# Patient Record
Sex: Female | Born: 1990 | Race: White | Hispanic: No | Marital: Single | Smoking: Never smoker
Health system: Southern US, Community
[De-identification: ages and names within clinical notes are randomized; demographics above are authoritative.]

## PROBLEM LIST (undated history)

## (undated) DIAGNOSIS — K589 Irritable bowel syndrome without diarrhea: Secondary | ICD-10-CM

## (undated) DIAGNOSIS — E785 Hyperlipidemia, unspecified: Secondary | ICD-10-CM

## (undated) HISTORY — PX: CHOLECYSTECTOMY: SHX55

---

## 2013-07-30 ENCOUNTER — Emergency Department (HOSPITAL_COMMUNITY)
Admission: EM | Admit: 2013-07-30 | Discharge: 2013-07-30 | Disposition: A | Discharge status: Home / Self Care | Payer: BC Managed Care – PPO | Attending: Emergency Medicine | Admitting: Emergency Medicine

## 2013-07-30 ENCOUNTER — Emergency Department (HOSPITAL_COMMUNITY): Payer: BC Managed Care – PPO

## 2013-07-30 ENCOUNTER — Encounter (HOSPITAL_COMMUNITY): Payer: Self-pay | Admitting: Emergency Medicine

## 2013-07-30 DIAGNOSIS — F411 Generalized anxiety disorder: Secondary | ICD-10-CM | POA: Insufficient documentation

## 2013-07-30 DIAGNOSIS — Z8719 Personal history of other diseases of the digestive system: Secondary | ICD-10-CM | POA: Insufficient documentation

## 2013-07-30 DIAGNOSIS — F419 Anxiety disorder, unspecified: Secondary | ICD-10-CM

## 2013-07-30 DIAGNOSIS — Z79899 Other long term (current) drug therapy: Secondary | ICD-10-CM | POA: Insufficient documentation

## 2013-07-30 HISTORY — DX: Hyperlipidemia, unspecified: E78.5

## 2013-07-30 HISTORY — DX: Irritable bowel syndrome without diarrhea: K58.9

## 2013-07-30 MED ORDER — LORAZEPAM 1 MG PO TABS: 1.0000 mg | ORAL_TABLET | Freq: Three times a day (TID) | ORAL | Status: AC | PRN

## 2013-07-30 MED ORDER — LORAZEPAM 1 MG PO TABS
1.0000 mg | ORAL_TABLET | Freq: Once | ORAL | Status: AC
Start: 2013-07-30 — End: 2013-07-30
  Administered 2013-07-30: 1 mg via ORAL
  Filled 2013-07-30: qty 1

## 2013-07-30 MED ORDER — LORAZEPAM 2 MG/ML IJ SOLN
1.0000 mg | Freq: Once | INTRAMUSCULAR | Status: AC
  Administered 2013-07-30: 1 mg via INTRAVENOUS
  Filled 2013-07-30: qty 1

## 2013-07-30 MED ORDER — DIPHENHYDRAMINE HCL 25 MG PO CAPS
50.0000 mg | ORAL_CAPSULE | Freq: Once | ORAL | Status: AC
  Administered 2013-07-30: 50 mg via ORAL
  Filled 2013-07-30: qty 2

## 2013-07-30 MED ORDER — SODIUM CHLORIDE 0.9 % IV BOLUS (SEPSIS)
1000.0000 mL | Freq: Once | INTRAVENOUS | Status: AC
  Administered 2013-07-30: 1000 mL via INTRAVENOUS

## 2013-07-30 MED ORDER — ONDANSETRON 4 MG PO TBDP
4.0000 mg | ORAL_TABLET | Freq: Once | ORAL | Status: AC
  Administered 2013-07-30: 4 mg via ORAL
  Filled 2013-07-30: qty 1

## 2013-07-30 NOTE — ED Provider Notes (Signed)
CSN: [id number]     Arrival date & time 07/30/13  1039 History   First MD Initiated Contact with Patient 07/30/13 1115     Chief Complaint  Patient presents with  . throat tightness      (Consider location/radiation/quality/duration/timing/severity/associated sxs/prior Treatment) HPI Pt presenting with c/o throat tightness and difficulty breathing.  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  Symptoms became gradually worse and she felt that her throat was swollen.  UPon presentation to the ED she is tearful and feels anxious.  She does state she was having difficulty controlling her nerves today.  No hx of anxiety or panic attacks.  No sore throat.  No fever.  No hives or itching rash.  No swelling of lips or tongue.  No new exposures or foods.  There are no other associated systemic symptoms, there are no other alleviating or modifying factors.   Past Medical History  Diagnosis Date  . Hyperlipemia   . IBS (irritable bowel syndrome)    Past Surgical History  Procedure Laterality Date  . Cholecystectomy     No family history on file. History  Substance Use Topics  . Smoking status: Never Smoker   . Smokeless tobacco: Not on file  . Alcohol Use: No   OB History   Grav Para Term Preterm Abortions TAB SAB Ect Mult Living                 Review of Systems ROS reviewed and all otherwise negative except for mentioned in HPI    Allergies  Review of patient's allergies indicates no known allergies.  Home Medications   Prior to Admission medications   Medication Sig Start Date End Date Taking? Authorizing Provider  aspirin-acetaminophen-caffeine (EXCEDRIN MIGRAINE) [phone] MG per tablet Take 1 tablet by mouth every 6 (six) hours as needed for headache or migraine.   Yes Historical Provider, MD  Naltrexone-Bupropion HCl ER (CONTRAVE) 8-90 MG TB12 Take 1 tablet by mouth 2 (two) times daily.   Yes Historical Provider,  MD  norgestimate-ethinyl estradiol (SPRINTEC 28) 0.25-35 MG-MCG tablet Take 1 tablet by mouth every morning.   Yes Historical Provider, MD  LORazepam (ATIVAN) 1 MG tablet Take 1 tablet (1 mg total) by mouth 3 (three) times daily as needed for anxiety. 07/30/13   Ethelda Chick, MD   BP 136/77  Pulse 97  Temp(Src) 98.9 F (37.2 C) (Oral)  Resp 18  SpO2 99%  LMP 07/30/2013 Vitals reviewed Physical Exam Physical Examination: General appearance - alert, anxious appearing, and in no distress Mental status - alert, oriented to person, place, and time Eyes - pupils equal and reactive, no scleral icterus, no eyelid edema Mouth - mucous membranes moist, pharynx normal without lesions, uvula midline, palate symmetric Neck - supple, no significant adenopathy Chest - clear to auscultation, no wheezes, rales or rhonchi, symmetric air entry, normal respiratory effort Heart - normal rate, regular rhythm, normal S1, S2, no murmurs, rubs, clicks or gallops Abdomen - soft, nontender, nondistended, no masses or organomegaly Extremities - peripheral pulses normal, no pedal edema, no clubbing or cyanosis Skin - normal coloration and turgor, no rashes Psych- tearful and anxious appearing, but cooperative  ED Course  Procedures (including critical care time)  2:54 PM on recheck patient is feeling improved, she is sleepy from ativan.   Labs Review Labs Reviewed - No data to display  Imaging Review Dg Neck Soft Tissue  07/30/2013  CLINICAL DATA:  Throat tightness  EXAM: NECK SOFT TISSUES - 1+ VIEW  COMPARISON:  None.  FINDINGS: Frontal and lateral views were obtained. The epiglottis and aryepiglottic folds appear normal. Prevertebral soft tissues are normal. There is no air-fluid level to suggest abscess. The tongue base region as well as the tonsils and adenoidal structures appear unremarkable. No bone lesions are identified.  IMPRESSION: No abnormality appreciable.   Electronically Signed   By: Bretta BangWilliam   Woodruff M.D.   On: 07/30/2013 11:46     EKG Interpretation None      MDM   Final diagnoses:  Anxiety    Pt presenting with c/o sensation of difficulty breathing and tightness in throat during nursing clinicals orientation.  Pt has normal exam, no rash, appears anxious and tearful.  No signs of angioedema or allergic reaction.  Pt treated with ativan which improved her symptoms.  Pt initially tachycardic but this resolved on recheck.  Suspect symptoms are due to anxiety.   Discharged with strict return precautions.  Pt agreeable with plan.    Ethelda ChickMartha K Linker, MD 07/30/13 [phone]

## 2013-07-30 NOTE — Progress Notes (Signed)
  CARE MANAGEMENT ED NOTE 07/30/2013  Patient:  Kylie Avery, Kylie Avery   Account Number:  [id number]  Date Initiated:  07/30/2013  Documentation initiated by:  Edd Arbour  Subjective/Objective Assessment:   23 yr old female [place] [employer] resident ([place] listed in [place]) in orientation for Nursing and c/o of "throat tightness" that started this am 0630 that is intermittent. Denies SOB at this time. Does not have hives or the     Subjective/Objective Assessment Detail:   appearance of an allergic reaction. Pt report some anxiety today saying she was "having problems with her nerves". Pt in NAD. EMS reports BP 147/100, HR 120, 98% on room air. treated with ativan in ED and improves  Confirm pcp is Dr Alinda Deem in [address]      Action/Plan:   CM spoke with pt and female at bedside Updated pcp in EPIC   Action/Plan Detail:   Anticipated DC Date:  07/30/2013     Status Recommendation to Physician:   Result of Recommendation:    Other ED Services  Consult Working Plan    DC Planning Services  Other  PCP issues  Outpatient Services - Pt will follow up    Choice offered to / List presented to:            Status of service:  Completed, signed off  ED Comments:   ED Comments Detail:

## 2013-07-30 NOTE — Discharge Instructions (Signed)
Return to the ED with any concerns including difficulty breathing, vomiting and not able to keep down liquids, fainting, decreased level of alertness/lethargy, or any other alarming symptoms °

## 2013-07-30 NOTE — ED Notes (Signed)
Per EMS in orientation for Nursing and c/o of "throat tightness" that started this am 0630 that is intermittent. Denies SOB at this time. Does not have hives or the appearance of an allergic reaction. Pt report some anxiety today saying she was "having problems with her nerves". Pt in NAD. EMS reports BP 147/100, HR 120, 98% on room air.

## 2015-12-05 IMAGING — CR DG NECK SOFT TISSUE
2 series · 2 of 2 positions shown · non-contrast
Comparison: None.

CLINICAL DATA: Throat tightness

EXAM:
NECK SOFT TISSUES - 1+ VIEW

[w soft tissue neck lat]
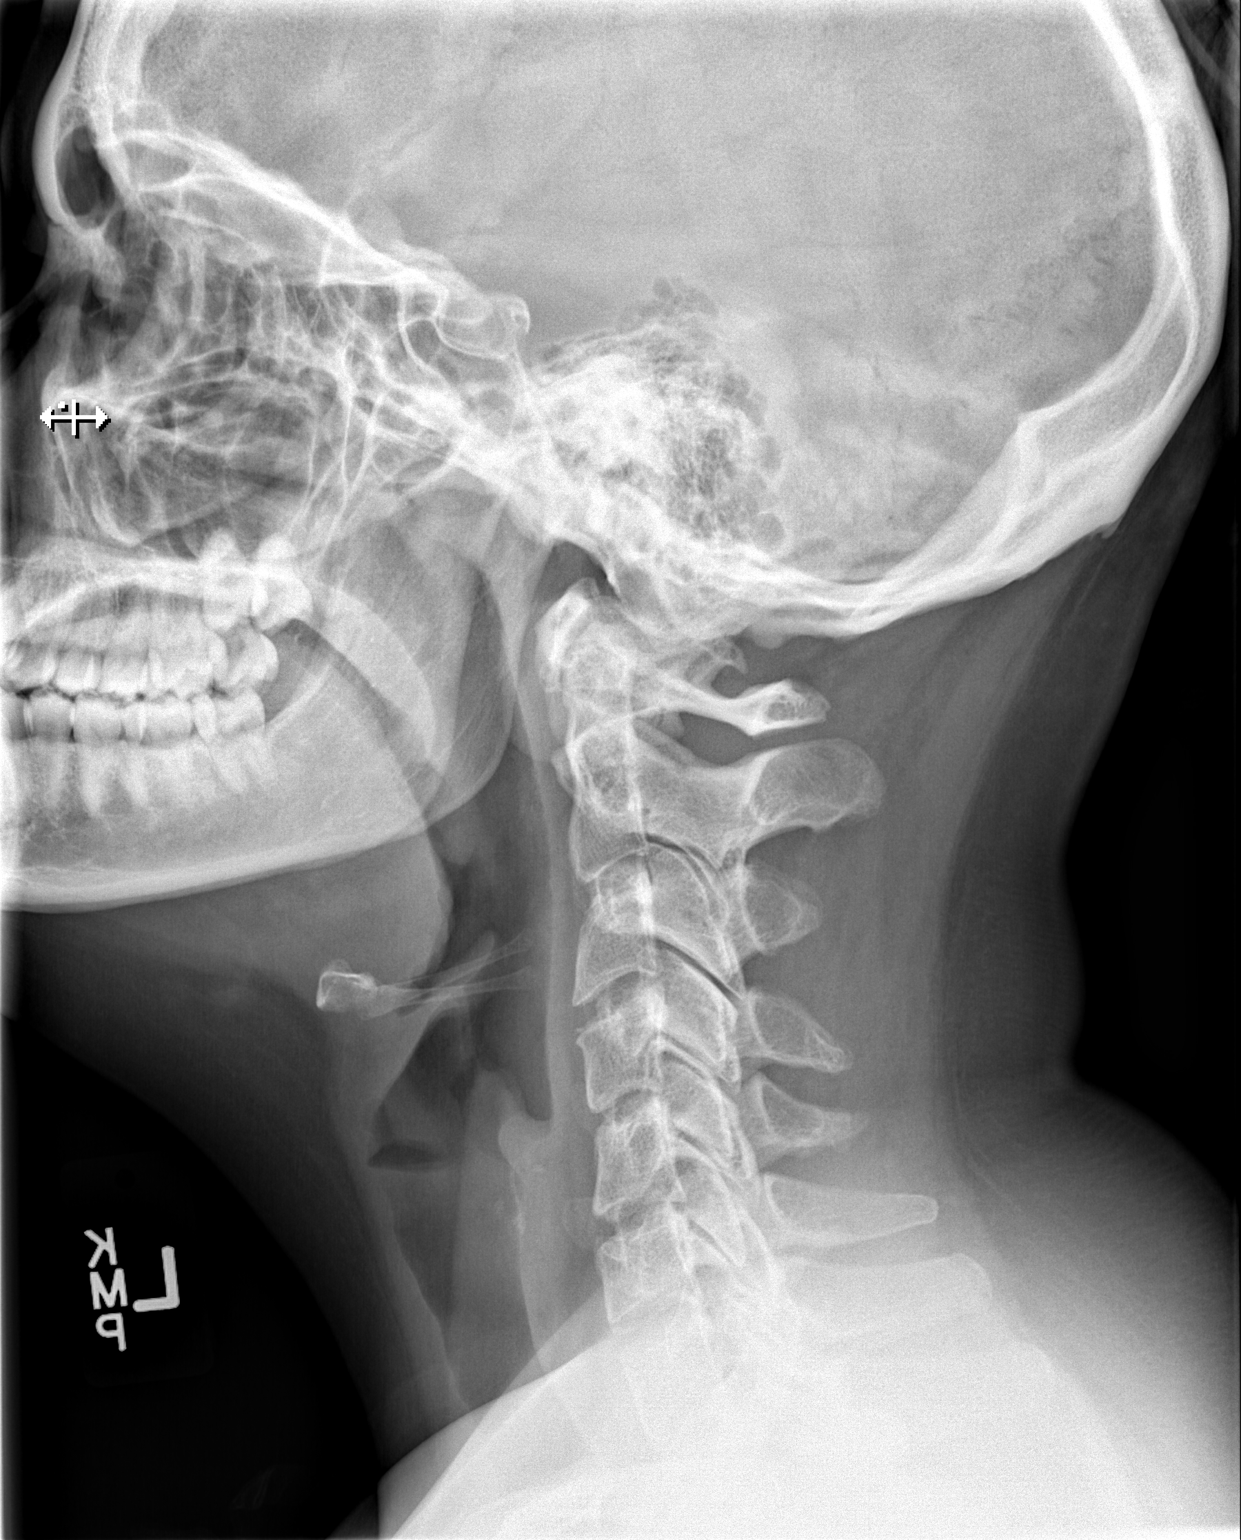

[w soft tissue neck ap]
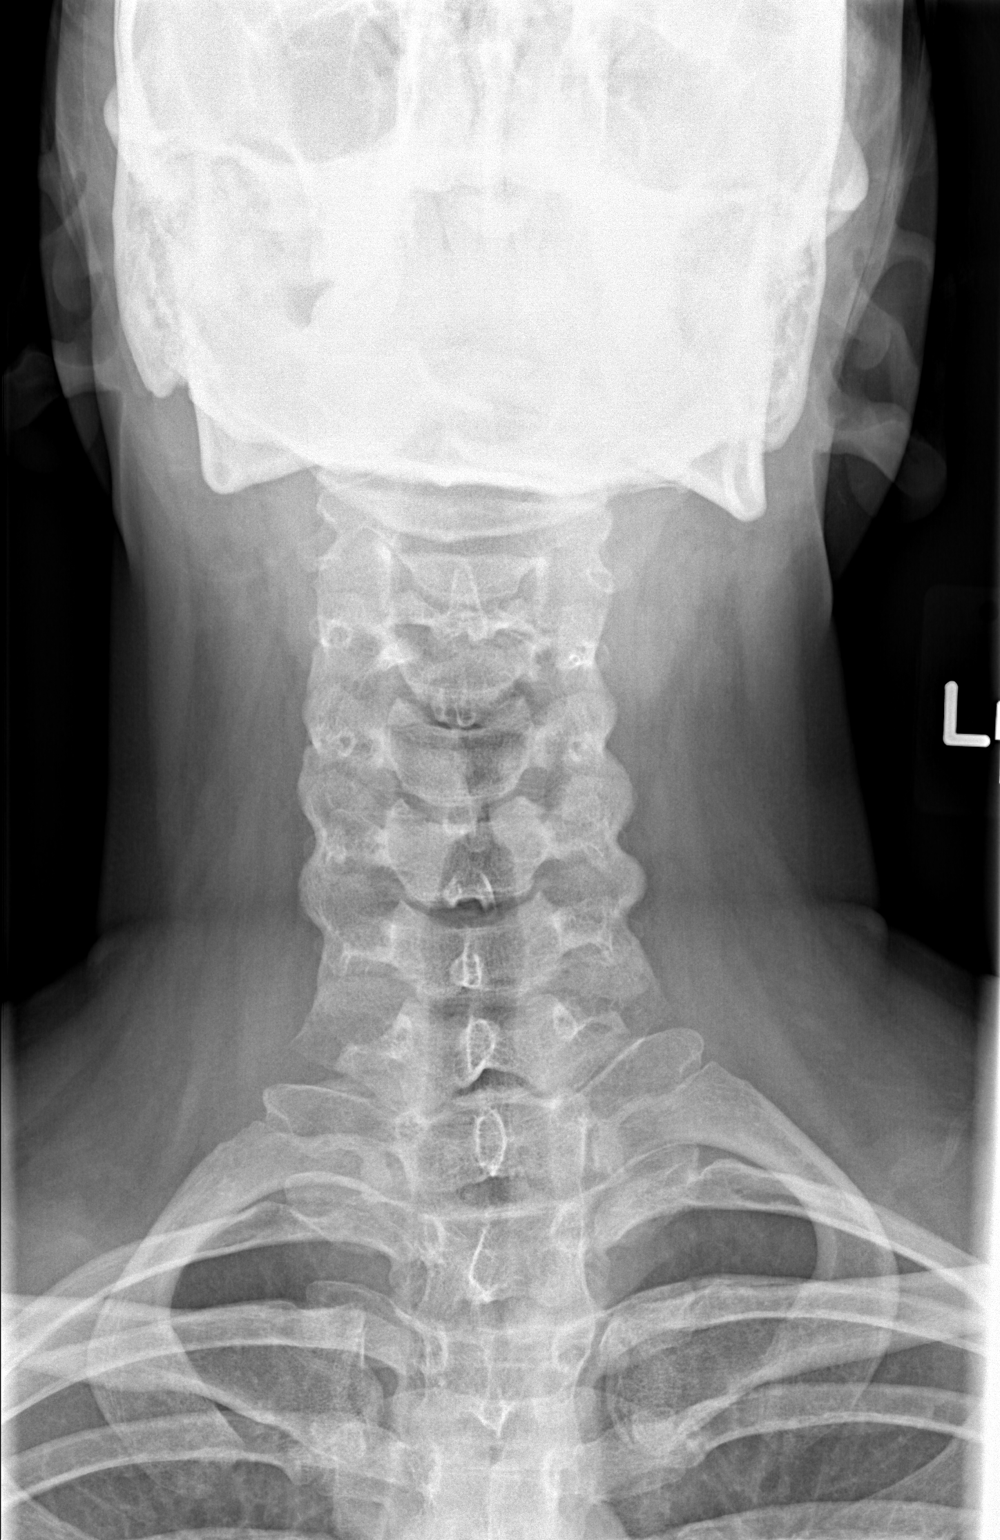

[2 of 2 positions shown; findings below may reference images not displayed]

FINDINGS: Frontal and lateral views were obtained. The epiglottis and
aryepiglottic folds appear normal. Prevertebral soft tissues are
normal. There is no air-fluid level to suggest abscess. The tongue
base region as well as the tonsils and adenoidal structures appear
unremarkable. No bone lesions are identified.
IMPRESSION: No abnormality appreciable.

## 2020-02-13 ENCOUNTER — Other Ambulatory Visit (HOSPITAL_COMMUNITY): Payer: Self-pay

## 2020-02-14 ENCOUNTER — Telehealth: Payer: Self-pay | Admitting: Family

## 2020-02-14 NOTE — Telephone Encounter (Signed)
Called to Discuss with patient about Covid symptoms and the use of the monoclonal antibody infusion for those with mild to moderate Covid symptoms and at a high risk of hospitalization.     Pt appears to qualify for this infusion due to co-morbid conditions and/or a member of an at-risk group in accordance with the FDA Emergency Use Authorization.   Kylie Avery had symptoms starting on 12/10 and tested positive on 12/12.. Symptoms include cough, fatigue, loss of taste/smell and shortness of breath. Qualifying risk factor is BMI >25 (37).  I was unable to reach Ms. Kylie Avery via phone and left a voicemail with the hotline number if she is interested in learning more or scheduling. No MyChart has been established.    Marcos Eke, NP 02/14/2020 5:13 PM
